# Patient Record
Sex: Female | Born: 1965 | Race: Black or African American | Hispanic: No | Marital: Single | Smoking: Current some day smoker
Health system: Southern US, Community
[De-identification: ages and names within clinical notes are randomized; demographics above are authoritative.]

---

## 2008-07-01 ENCOUNTER — Ambulatory Visit: Payer: Self-pay | Admitting: Radiology

## 2008-07-01 ENCOUNTER — Emergency Department (HOSPITAL_BASED_OUTPATIENT_CLINIC_OR_DEPARTMENT_OTHER): Admission: EM | Admit: 2008-07-01 | Discharge: 2008-07-01 | Payer: Self-pay | Admitting: Emergency Medicine

## 2010-05-23 LAB — DIFFERENTIAL
Basophils Absolute: 0.1 10*3/uL (ref 0.0–0.1)
Basophils Relative: 1 % (ref 0–1)
Eosinophils Absolute: 0.1 10*3/uL (ref 0.0–0.7)
Monocytes Absolute: 0.3 10*3/uL (ref 0.1–1.0)
Neutro Abs: 5.8 10*3/uL (ref 1.7–7.7)

## 2010-05-23 LAB — URINALYSIS, ROUTINE W REFLEX MICROSCOPIC
Glucose, UA: NEGATIVE mg/dL
Ketones, ur: 15 mg/dL — AB
Protein, ur: NEGATIVE mg/dL
Urobilinogen, UA: 1 mg/dL (ref 0.0–1.0)

## 2010-05-23 LAB — CBC
Hemoglobin: 12.5 g/dL (ref 12.0–15.0)
MCHC: 35.5 g/dL (ref 30.0–36.0)
MCV: 96.6 fL (ref 78.0–100.0)
RDW: 12.1 % (ref 11.5–15.5)

## 2010-05-23 LAB — BASIC METABOLIC PANEL
CO2: 28 mEq/L (ref 19–32)
Calcium: 8.9 mg/dL (ref 8.4–10.5)
Chloride: 101 mEq/L (ref 96–112)
Creatinine, Ser: 0.7 mg/dL (ref 0.4–1.2)
Glucose, Bld: 104 mg/dL — ABNORMAL HIGH (ref 70–99)

## 2010-05-23 LAB — POCT CARDIAC MARKERS
CKMB, poc: <1 ng/mL — ABNORMAL LOW (ref 1.0–8.0)
Myoglobin, poc: 21.3 ng/mL (ref 12–200)
Troponin i, poc: <0.05 ng/mL (ref 0.00–0.09)

## 2010-05-23 LAB — URINE MICROSCOPIC-ADD ON

## 2012-03-18 ENCOUNTER — Encounter (HOSPITAL_BASED_OUTPATIENT_CLINIC_OR_DEPARTMENT_OTHER): Payer: Self-pay | Admitting: *Deleted

## 2012-03-18 ENCOUNTER — Emergency Department (HOSPITAL_BASED_OUTPATIENT_CLINIC_OR_DEPARTMENT_OTHER)
Admission: EM | Admit: 2012-03-18 | Discharge: 2012-03-18 | Disposition: A | Discharge status: Home / Self Care | Payer: BC Managed Care – PPO | Attending: Emergency Medicine | Admitting: Emergency Medicine

## 2012-03-18 DIAGNOSIS — Y929 Unspecified place or not applicable: Secondary | ICD-10-CM | POA: Insufficient documentation

## 2012-03-18 DIAGNOSIS — S39012A Strain of muscle, fascia and tendon of lower back, initial encounter: Secondary | ICD-10-CM

## 2012-03-18 DIAGNOSIS — F17200 Nicotine dependence, unspecified, uncomplicated: Secondary | ICD-10-CM | POA: Insufficient documentation

## 2012-03-18 DIAGNOSIS — X500XXA Overexertion from strenuous movement or load, initial encounter: Secondary | ICD-10-CM | POA: Insufficient documentation

## 2012-03-18 DIAGNOSIS — Y9389 Activity, other specified: Secondary | ICD-10-CM | POA: Insufficient documentation

## 2012-03-18 DIAGNOSIS — S335XXA Sprain of ligaments of lumbar spine, initial encounter: Secondary | ICD-10-CM | POA: Insufficient documentation

## 2012-03-18 MED ORDER — CYCLOBENZAPRINE HCL 10 MG PO TABS: 10.0000 mg | ORAL_TABLET | Freq: Two times a day (BID) | ORAL | Status: DC | PRN

## 2012-03-18 MED ORDER — PREDNISONE 10 MG PO TABS: 20.0000 mg | ORAL_TABLET | Freq: Every day | ORAL | Status: AC

## 2012-03-18 MED ORDER — OXYCODONE-ACETAMINOPHEN 5-325 MG PO TABS: 1.0000 | ORAL_TABLET | ORAL | Status: AC | PRN

## 2012-03-18 NOTE — ED Provider Notes (Addendum)
History     CSN: [id number]  Arrival date & time 03/18/12  0940   First MD Initiated Contact with Patient 03/18/12 0957      Chief Complaint  Patient presents with  . Back Pain    (Consider location/radiation/quality/duration/timing/severity/associated sxs/prior treatment) HPI  47 y.o. Female with back pain for five days.  Thinks she may have strained back helping her mother up from fall.  Pain is low back non radiating crampy, constant, heating pad with some relief, took ibuprofen without relief.  Worsens with movement especially standing still in one spot.  Denies radiation to legs, focal numbness, weakness, uti symptoms, fever, chills, nausea, vomiting. PMD-   History reviewed. No pertinent past medical history.  History reviewed. No pertinent past surgical history.  No family history on file.  History  Substance Use Topics  . Smoking status: Current Some Day Smoker  . Smokeless tobacco: Not on file  . Alcohol Use: Yes     Comment: occassionally    OB History    Grav Para Term Preterm Abortions TAB SAB Ect Mult Living                  Review of Systems  All other systems reviewed and are negative.    Allergies  Review of patient's allergies indicates no known allergies.  Home Medications  No current outpatient prescriptions on file.  BP 121/78  Pulse 89  Temp 98.2 F (36.8 C) (Oral)  Resp 20  Ht 5\' 6"  (1.676 m)  Wt 185 lb (83.915 kg)  BMI 29.86 kg/m2  SpO2 98%  Physical Exam  Nursing note and vitals reviewed. Constitutional: She is oriented to person, place, and time. She appears well-developed and well-nourished.  HENT:  Head: Normocephalic and atraumatic.  Right Ear: External ear normal.  Left Ear: External ear normal.  Nose: Nose normal.  Mouth/Throat: Oropharynx is clear and moist.  Eyes: Conjunctivae normal and EOM are normal. Pupils are equal, round, and reactive to light.  Neck: Normal range of motion. Neck supple.  Cardiovascular:  Normal rate, regular rhythm, normal heart sounds and intact distal pulses.   Pulmonary/Chest: Effort normal and breath sounds normal.  Abdominal: Soft. Bowel sounds are normal.  Musculoskeletal: Normal range of motion.       Back visual normal, full arom, bilateral ttp low back paravertebral  Neurological: She is alert and oriented to person, place, and time. She has normal strength and normal reflexes. No sensory deficit. GCS eye subscore is 4. GCS verbal subscore is 5. GCS motor subscore is 6.  Skin: Skin is warm and dry.  Psychiatric: She has a normal mood and affect. Her behavior is normal. Judgment and thought content normal.    ED Course  Procedures (including critical care time)  Labs Reviewed - No data to display No results found.   No diagnosis found.    MDM  Patient with back pain.  No neurological deficits and normal neuro exam.  Patient can walk but states is painful.  No loss of bowel or bladder control.  No concern for cauda equina.  No fever, night sweats, weight loss, h/o cancer, IVDU.  RICE protocol and pain medicine indicated and discussed with patient.         Hilario Quarry, MD 03/18/12 1007  Hilario Quarry, MD 03/22/12 [phone]

## 2012-03-18 NOTE — ED Notes (Signed)
Patient states she has had lower back pain radiating into her buttocks for the last 5 days after lifting her mother who fell.

## 2012-04-01 ENCOUNTER — Encounter (HOSPITAL_BASED_OUTPATIENT_CLINIC_OR_DEPARTMENT_OTHER): Payer: Self-pay | Admitting: *Deleted

## 2012-04-01 ENCOUNTER — Emergency Department (HOSPITAL_BASED_OUTPATIENT_CLINIC_OR_DEPARTMENT_OTHER)
Admission: EM | Admit: 2012-04-01 | Discharge: 2012-04-01 | Disposition: A | Discharge status: Home / Self Care | Payer: BC Managed Care – PPO | Attending: Emergency Medicine | Admitting: Emergency Medicine

## 2012-04-01 DIAGNOSIS — R059 Cough, unspecified: Secondary | ICD-10-CM | POA: Insufficient documentation

## 2012-04-01 DIAGNOSIS — R05 Cough: Secondary | ICD-10-CM | POA: Insufficient documentation

## 2012-04-01 DIAGNOSIS — J029 Acute pharyngitis, unspecified: Secondary | ICD-10-CM

## 2012-04-01 DIAGNOSIS — H6690 Otitis media, unspecified, unspecified ear: Secondary | ICD-10-CM | POA: Insufficient documentation

## 2012-04-01 DIAGNOSIS — F17200 Nicotine dependence, unspecified, uncomplicated: Secondary | ICD-10-CM | POA: Insufficient documentation

## 2012-04-01 DIAGNOSIS — H6691 Otitis media, unspecified, right ear: Secondary | ICD-10-CM

## 2012-04-01 DIAGNOSIS — R6883 Chills (without fever): Secondary | ICD-10-CM | POA: Insufficient documentation

## 2012-04-01 MED ORDER — AZITHROMYCIN 250 MG PO TABS: 250.0000 mg | ORAL_TABLET | Freq: Every day | ORAL | Status: AC

## 2012-04-01 MED ORDER — NAPROXEN 500 MG PO TABS: 500.0000 mg | ORAL_TABLET | Freq: Two times a day (BID) | ORAL | Status: DC

## 2012-04-01 NOTE — ED Notes (Signed)
Patient states she developed a sore throat and right ear pain 3 days ago.  Mild non productive cough.  Denies fever.

## 2012-04-01 NOTE — ED Provider Notes (Signed)
History     CSN: [id number]  Arrival date & time 04/01/12  4098   First MD Initiated Contact with Patient 04/01/12 [phone]      Chief Complaint  Patient presents with  . Sore Throat  . Otalgia    right    (Consider location/radiation/quality/duration/timing/severity/associated sxs/prior treatment) Patient is a 47 y.o. female presenting with pharyngitis and ear pain. The history is provided by the patient.  Sore Throat Pertinent negatives include no chest pain, no abdominal pain, no headaches and no shortness of breath.  Otalgia Associated symptoms: cough and sore throat   Associated symptoms: no abdominal pain, no fever, no headaches and no rash    patient presents with a complaint of sore throat on the right side of right ear pain for 3 days associated with a mild nonproductive cough no fever but does state chills. Patient's been treating it with over-the-counter cold and cough meds. Throat pain is listed as 8/10 made worse with swallowing and also makes your her when she swallows.  History reviewed. No pertinent past medical history.  History reviewed. No pertinent past surgical history.  No family history on file.  History  Substance Use Topics  . Smoking status: Current Some Day Smoker  . Smokeless tobacco: Not on file  . Alcohol Use: Yes     Comment: occassionally    OB History   Grav Para Term Preterm Abortions TAB SAB Ect Mult Living                  Review of Systems  Constitutional: Positive for chills. Negative for fever.  HENT: Positive for ear pain and sore throat. Negative for voice change and sinus pressure.   Eyes: Negative for redness.  Respiratory: Positive for cough. Negative for shortness of breath.   Cardiovascular: Negative for chest pain.  Gastrointestinal: Negative for abdominal pain.  Genitourinary: Negative for dysuria.  Musculoskeletal: Negative for back pain.  Skin: Negative for rash.  Neurological: Negative for headaches.   Hematological: Does not bruise/bleed easily.    Allergies  Review of patient's allergies indicates no known allergies.  Home Medications   Current Outpatient Rx  Name  Route  Sig  Dispense  Refill  . azithromycin (ZITHROMAX) 250 MG tablet   Oral   Take 1 tablet (250 mg total) by mouth daily. Take first 2 tablets together, then 1 every day until finished.   6 tablet   0   . cyclobenzaprine (FLEXERIL) 10 MG tablet   Oral   Take 1 tablet (10 mg total) by mouth 2 (two) times daily as needed for muscle spasms.   20 tablet   0   . naproxen (NAPROSYN) 500 MG tablet   Oral   Take 1 tablet (500 mg total) by mouth 2 (two) times daily.   14 tablet   0   . predniSONE (DELTASONE) 10 MG tablet   Oral   Take 2 tablets (20 mg total) by mouth daily.   15 tablet   0     BP 122/72  Pulse 94  Temp(Src) 99.7 F (37.6 C) (Oral)  Resp 18  SpO2 100%  Physical Exam  Nursing note and vitals reviewed. Constitutional: She is oriented to person, place, and time. She appears well-developed and well-nourished. No distress.  HENT:  Head: Normocephalic and atraumatic.  Left Ear: External ear normal.  Mouth/Throat: Oropharynx is clear and moist. No oropharyngeal exudate.  Right eardrum is erythematous with slight bulging.  Eyes: Conjunctivae and EOM are  normal. Pupils are equal, round, and reactive to light.  Neck: Normal range of motion. Neck supple. No tracheal deviation present.  Cardiovascular: Normal rate, regular rhythm and normal heart sounds.   No murmur heard. Pulmonary/Chest: Effort normal and breath sounds normal. No stridor. No respiratory distress. She has no wheezes. She has no rales.  Abdominal: Soft. Bowel sounds are normal. There is no tenderness.  Musculoskeletal: Normal range of motion. She exhibits no edema.  Lymphadenopathy:    She has cervical adenopathy.  Neurological: She is alert and oriented to person, place, and time. No cranial nerve deficit. She exhibits  normal muscle tone. Coordination normal.  Skin: Skin is warm. No rash noted.    ED Course  Procedures (including critical care time)  Labs Reviewed - No data to display No results found.   1. Otitis media of right ear   2. Pharyngitis       MDM  Physical exam consistent with a right otitis media. Suspect a viral upper respiratory infection with secondary ear infection. Pink sore throat is also viral related however if you were to be strep related to the Zithromax will resolve strep pharyngitis.        Shelda Jakes, MD 04/01/12 [phone]

## 2014-08-31 ENCOUNTER — Encounter (HOSPITAL_BASED_OUTPATIENT_CLINIC_OR_DEPARTMENT_OTHER): Payer: Self-pay | Admitting: *Deleted

## 2014-08-31 ENCOUNTER — Emergency Department (HOSPITAL_BASED_OUTPATIENT_CLINIC_OR_DEPARTMENT_OTHER)
Admission: EM | Admit: 2014-08-31 | Discharge: 2014-08-31 | Disposition: A | Discharge status: Home / Self Care | Payer: BC Managed Care – PPO | Attending: Emergency Medicine | Admitting: Emergency Medicine

## 2014-08-31 DIAGNOSIS — Z7952 Long term (current) use of systemic steroids: Secondary | ICD-10-CM | POA: Diagnosis not present

## 2014-08-31 DIAGNOSIS — Z791 Long term (current) use of non-steroidal anti-inflammatories (NSAID): Secondary | ICD-10-CM | POA: Insufficient documentation

## 2014-08-31 DIAGNOSIS — Z720 Tobacco use: Secondary | ICD-10-CM | POA: Insufficient documentation

## 2014-08-31 DIAGNOSIS — A5901 Trichomonal vulvovaginitis: Secondary | ICD-10-CM | POA: Insufficient documentation

## 2014-08-31 DIAGNOSIS — Z792 Long term (current) use of antibiotics: Secondary | ICD-10-CM | POA: Insufficient documentation

## 2014-08-31 DIAGNOSIS — N390 Urinary tract infection, site not specified: Secondary | ICD-10-CM

## 2014-08-31 DIAGNOSIS — Z3202 Encounter for pregnancy test, result negative: Secondary | ICD-10-CM | POA: Insufficient documentation

## 2014-08-31 DIAGNOSIS — A599 Trichomoniasis, unspecified: Secondary | ICD-10-CM

## 2014-08-31 DIAGNOSIS — R300 Dysuria: Secondary | ICD-10-CM | POA: Diagnosis present

## 2014-08-31 LAB — URINE MICROSCOPIC-ADD ON

## 2014-08-31 LAB — URINALYSIS, ROUTINE W REFLEX MICROSCOPIC
BILIRUBIN URINE: NEGATIVE
Glucose, UA: NEGATIVE mg/dL
Ketones, ur: NEGATIVE mg/dL
Nitrite: NEGATIVE
PH: 5.5 (ref 5.0–8.0)
PROTEIN: NEGATIVE mg/dL
SPECIFIC GRAVITY, URINE: 1.016 (ref 1.005–1.030)
UROBILINOGEN UA: 0.2 mg/dL (ref 0.0–1.0)

## 2014-08-31 LAB — PREGNANCY, URINE: PREG TEST UR: NEGATIVE

## 2014-08-31 MED ORDER — METRONIDAZOLE 500 MG PO TABS: 2000.0000 mg | ORAL_TABLET | Freq: Once | ORAL | Status: AC

## 2014-08-31 MED ORDER — CEPHALEXIN 500 MG PO CAPS: 500.0000 mg | ORAL_CAPSULE | Freq: Four times a day (QID) | ORAL | Status: AC

## 2014-08-31 NOTE — Discharge Instructions (Signed)
Return to the ED with any concerns including vomiting and not able to keep down liquids or antibiotics, abdominal pain, decreased level of alertness/lethargy, or any other alarming symptoms

## 2014-08-31 NOTE — ED Notes (Signed)
MD at bedside. 

## 2014-08-31 NOTE — ED Provider Notes (Signed)
CSN: [id number]     Arrival date & time 08/31/14  4782 History   First MD Initiated Contact with Patient 08/31/14 [phone]     Chief Complaint  Patient presents with  . Dysuria     (Consider location/radiation/quality/duration/timing/severity/associated sxs/prior Treatment) HPI  Pt presenting with c/o 2 days of dysuria.  She feels burning with urination.  No frequency or urgency.  No vomiting or abdominal pain.  Had some mild right sided low back pain yesterday, but no pain now.  Has had UTI in the past with similar symptoms- this was years ago.  No recent antibitoics.  No vaginal bleeding or discharge.  She has not had any treatment prior to arrival. There are no other associated systemic symptoms, there are no other alleviating or modifying factors.   History reviewed. No pertinent past medical history. History reviewed. No pertinent past surgical history. No family history on file. History  Substance Use Topics  . Smoking status: Current Some Day Smoker  . Smokeless tobacco: Not on file  . Alcohol Use: Yes     Comment: occassionally   OB History    No data available     Review of Systems  ROS reviewed and all otherwise negative except for mentioned in HPI    Allergies  Review of patient's allergies indicates no known allergies.  Home Medications   Prior to Admission medications   Medication Sig Start Date End Date Taking? Authorizing Provider  azithromycin (ZITHROMAX) 250 MG tablet Take 1 tablet (250 mg total) by mouth daily. Take first 2 tablets together, then 1 every day until finished. 04/01/12   Vanetta Mulders, MD  cephALEXin (KEFLEX) 500 MG capsule Take 1 capsule (500 mg total) by mouth 4 (four) times daily. 08/31/14   Jerelyn Scott, MD  cyclobenzaprine (FLEXERIL) 10 MG tablet Take 1 tablet (10 mg total) by mouth 2 (two) times daily as needed for muscle spasms. 03/18/12   Margarita Grizzle, MD  metroNIDAZOLE (FLAGYL) 500 MG tablet Take 4 tablets (2,000 mg total) by mouth once.  08/31/14   Jerelyn Scott, MD  naproxen (NAPROSYN) 500 MG tablet Take 1 tablet (500 mg total) by mouth 2 (two) times daily. 04/01/12   Vanetta Mulders, MD  predniSONE (DELTASONE) 10 MG tablet Take 2 tablets (20 mg total) by mouth daily. 03/18/12   Margarita Grizzle, MD   BP 133/80 mmHg  Pulse 85  Temp(Src) 97.9 F (36.6 C) (Oral)  Resp 16  Ht  (1.676 m)  Wt 199 lb 3.2 oz (90.357 kg)  BMI 32.17 kg/m2  SpO2 100%  LMP 07/01/2014  Vitals reviewed Physical Exam  Physical Examination: General appearance - alert, well appearing, and in no distress Mental status - alert, oriented to person, place, and time Eyes - no conjunctival injection, no scleral icterus Mouth - mucous membranes moist, pharynx normal without lesions Chest - clear to auscultation, no wheezes, rales or rhonchi, symmetric air entry Heart - normal rate, regular rhythm, normal S1, S2, no murmurs, rubs, clicks or gallops Abdomen - soft, nontender, nondistended, no masses or organomegaly Back exam - no CVA tenderness, no midline tenderness Extremities - peripheral pulses normal, no pedal edema, no clubbing or cyanosis Skin - normal coloration and turgor, no rashes  ED Course  Procedures (including critical care time) Labs Review Labs Reviewed  URINALYSIS, ROUTINE W REFLEX MICROSCOPIC (NOT AT [facility]) - Abnormal; Notable for the following:    APPearance CLOUDY (*)    Hgb urine dipstick LARGE (*)    Leukocytes,  UA MODERATE (*)    All other components within normal limits  URINE MICROSCOPIC-ADD ON - Abnormal; Notable for the following:    Squamous Epithelial / LPF FEW (*)    Bacteria, UA MANY (*)    All other components within normal limits  PREGNANCY, URINE  GC/CHLAMYDIA PROBE AMP ([place]) [facility]    Imaging Review No results found.   EKG Interpretation None      MDM   Final diagnoses:  UTI (lower urinary tract infection)  Trichomonas infection    Pt presenting with c/o burning with urination, no  abdominal pain.  Pt advised of UA c/w UTI as well as trichomonas present.  Given rx for keflex and flagyl.  Gc/chlamydia sent,  No abdominal tenderness on exam to warrant pelvic,  No concern for PID.   Patient is overall nontoxic and well hydrated in appearance.   Discharged with strict return precautions.  Pt agreeable with plan.     Jerelyn ScottMartha Linker, MD 08/31/14 [phone]

## 2014-08-31 NOTE — ED Notes (Signed)
Pt c/o burning with urination and lower abd pressure. Pt denies N/V but sts she's had "a little" diarrhea.

## 2014-09-01 LAB — GC/CHLAMYDIA PROBE AMP (~~LOC~~) NOT AT ARMC
Chlamydia: NEGATIVE
NEISSERIA GONORRHEA: NEGATIVE

## 2020-01-14 ENCOUNTER — Encounter (HOSPITAL_BASED_OUTPATIENT_CLINIC_OR_DEPARTMENT_OTHER): Payer: Self-pay | Admitting: *Deleted

## 2020-01-14 ENCOUNTER — Emergency Department (HOSPITAL_BASED_OUTPATIENT_CLINIC_OR_DEPARTMENT_OTHER): Payer: BC Managed Care – PPO

## 2020-01-14 ENCOUNTER — Emergency Department (HOSPITAL_BASED_OUTPATIENT_CLINIC_OR_DEPARTMENT_OTHER)
Admission: EM | Admit: 2020-01-14 | Discharge: 2020-01-14 | Disposition: A | Discharge status: Home / Self Care | Payer: BC Managed Care – PPO | Attending: Emergency Medicine | Admitting: Emergency Medicine

## 2020-01-14 ENCOUNTER — Other Ambulatory Visit: Payer: Self-pay

## 2020-01-14 DIAGNOSIS — M545 Low back pain, unspecified: Secondary | ICD-10-CM

## 2020-01-14 DIAGNOSIS — R1033 Periumbilical pain: Secondary | ICD-10-CM | POA: Diagnosis not present

## 2020-01-14 DIAGNOSIS — F17200 Nicotine dependence, unspecified, uncomplicated: Secondary | ICD-10-CM | POA: Insufficient documentation

## 2020-01-14 DIAGNOSIS — R109 Unspecified abdominal pain: Secondary | ICD-10-CM | POA: Diagnosis present

## 2020-01-14 LAB — URINALYSIS, MICROSCOPIC (REFLEX)

## 2020-01-14 LAB — CBC WITH DIFFERENTIAL/PLATELET
Abs Immature Granulocytes: 0.03 10*3/uL (ref 0.00–0.07)
Basophils Absolute: 0.1 10*3/uL (ref 0.0–0.1)
Basophils Relative: 1 %
Eosinophils Absolute: 0.1 10*3/uL (ref 0.0–0.5)
Eosinophils Relative: 1 %
HCT: 38.3 % (ref 36.0–46.0)
Hemoglobin: 12.8 g/dL (ref 12.0–15.0)
Immature Granulocytes: 0 %
Lymphocytes Relative: 32 %
Lymphs Abs: 2.3 10*3/uL (ref 0.7–4.0)
MCH: 33.7 pg (ref 26.0–34.0)
MCHC: 33.4 g/dL (ref 30.0–36.0)
MCV: 100.8 fL — ABNORMAL HIGH (ref 80.0–100.0)
Monocytes Absolute: 0.4 10*3/uL (ref 0.1–1.0)
Monocytes Relative: 6 %
Neutro Abs: 4.3 10*3/uL (ref 1.7–7.7)
Neutrophils Relative %: 60 %
Platelets: 186 10*3/uL (ref 150–400)
RBC: 3.8 MIL/uL — ABNORMAL LOW (ref 3.87–5.11)
RDW: 13 % (ref 11.5–15.5)
WBC: 7.2 10*3/uL (ref 4.0–10.5)
nRBC: 0 % (ref 0.0–0.2)

## 2020-01-14 LAB — COMPREHENSIVE METABOLIC PANEL
ALT: 89 U/L — ABNORMAL HIGH (ref 0–44)
AST: 149 U/L — ABNORMAL HIGH (ref 15–41)
Albumin: 4 g/dL (ref 3.5–5.0)
Alkaline Phosphatase: 64 U/L (ref 38–126)
Anion gap: 9 (ref 5–15)
BUN: 12 mg/dL (ref 6–20)
CO2: 27 mmol/L (ref 22–32)
Calcium: 9.4 mg/dL (ref 8.9–10.3)
Chloride: 101 mmol/L (ref 98–111)
Creatinine, Ser: 0.6 mg/dL (ref 0.44–1.00)
GFR, Estimated: >60 mL/min (ref >60)
Glucose, Bld: 116 mg/dL — ABNORMAL HIGH (ref 70–99)
Potassium: 3.8 mmol/L (ref 3.5–5.1)
Sodium: 137 mmol/L (ref 135–145)
Total Bilirubin: 0.6 mg/dL (ref 0.3–1.2)
Total Protein: 7.8 g/dL (ref 6.5–8.1)

## 2020-01-14 LAB — LIPASE, BLOOD: Lipase: 24 U/L (ref 11–51)

## 2020-01-14 LAB — URINALYSIS, ROUTINE W REFLEX MICROSCOPIC
Bilirubin Urine: NEGATIVE
Glucose, UA: NEGATIVE mg/dL
Hgb urine dipstick: NEGATIVE
Ketones, ur: NEGATIVE mg/dL
Nitrite: NEGATIVE
Protein, ur: NEGATIVE mg/dL
Specific Gravity, Urine: 1.015 (ref 1.005–1.030)
pH: 6.5 (ref 5.0–8.0)

## 2020-01-14 LAB — PREGNANCY, URINE: Preg Test, Ur: NEGATIVE

## 2020-01-14 MED ORDER — NAPROXEN 500 MG PO TABS: 500.0000 mg | ORAL_TABLET | Freq: Two times a day (BID) | ORAL | 0 refills | Status: AC

## 2020-01-14 MED ORDER — METHOCARBAMOL 500 MG PO TABS: 500.0000 mg | ORAL_TABLET | Freq: Three times a day (TID) | ORAL | 0 refills | Status: AC | PRN

## 2020-01-14 MED ORDER — ALUM & MAG HYDROXIDE-SIMETH 200-200-20 MG/5ML PO SUSP
30.0000 mL | Freq: Once | ORAL | Status: AC
  Administered 2020-01-14: 30 mL via ORAL
  Filled 2020-01-14: qty 30

## 2020-01-14 MED ORDER — LIDOCAINE VISCOUS HCL 2 % MT SOLN
15.0000 mL | Freq: Once | OROMUCOSAL | Status: AC
  Administered 2020-01-14: 15 mL via ORAL
  Filled 2020-01-14: qty 15

## 2020-01-14 MED ORDER — IOHEXOL 300 MG/ML  SOLN
100.0000 mL | Freq: Once | INTRAMUSCULAR | Status: AC
  Administered 2020-01-14: 100 mL via INTRAVENOUS

## 2020-01-14 NOTE — Discharge Instructions (Addendum)
Like we discussed, I am prescribing 2 medications.  The first medication is called naproxen.  This is an anti-inflammatory.  Please take it as prescribed.  Please take it with food to prevent upsetting your stomach.  The next medication is called Robaxin.  This is a muscle relaxer.  Please do not mix this medication with alcohol.  Do not operate a motor vehicle after taking it.  Please stop taking Flexeril as well as tizanidine.  These are both muscle relaxers.  You do not need to mix these medications.  If you develop any new or worsening symptoms you need to return to the emergency department for reevaluation.  Otherwise, please follow-up with your regular doctor.  It was a pleasure to meet you.

## 2020-01-14 NOTE — ED Triage Notes (Signed)
Sent here form [facility] for r/o appendicitis , pt c/o right flank pain  which radiates around to right lower abd x 1 week

## 2020-01-14 NOTE — ED Provider Notes (Signed)
[facility] EMERGENCY DEPARTMENT Provider Note   CSN: [id number] Arrival date & time: 01/14/20  1528     History Chief Complaint  Patient presents with  . Flank Pain    Colleen Ball is a 54 y.o. female.  HPI Patient is a 54 year old female who presents to the emergency department due to abdominal pain as well as back pain.  Patient states about 1.5 months ago she fell and began experiencing right low back pain.  She has taken muscle relaxers for this with mild intermittent improvement.  About 1 week ago she began experiencing abdominal pain that is stabbing and intermittent.  Seems to worsen with eating and palpation.  She was evaluated at urgent care earlier today and recommended that she come to the emergency department for appendicitis rule out.  No fevers, chills, nausea, vomiting, diarrhea, chest pain, shortness of breath, urinary changes.    History reviewed. No pertinent past medical history.  There are no problems to display for this patient.   History reviewed. No pertinent surgical history.   OB History   No obstetric history on file.     No family history on file.  Social History   Tobacco Use  . Smoking status: Current Some Day Smoker  Substance Use Topics  . Alcohol use: Yes    Comment: occassionally  . Drug use: No    Home Medications Prior to Admission medications   Medication Sig Start Date End Date Taking? Authorizing Provider  azithromycin (ZITHROMAX) 250 MG tablet Take 1 tablet (250 mg total) by mouth daily. Take first 2 tablets together, then 1 every day until finished. 04/01/12   Vanetta MuldersZackowski, Scott, MD  cephALEXin (KEFLEX) 500 MG capsule Take 1 capsule (500 mg total) by mouth 4 (four) times daily. 08/31/14   Mabe, Latanya MaudlinMartha L, MD  cyclobenzaprine (FLEXERIL) 10 MG tablet Take 1 tablet (10 mg total) by mouth 2 (two) times daily as needed for muscle spasms. 03/18/12   Margarita Grizzleay, Danielle, MD  metroNIDAZOLE (FLAGYL) 500 MG tablet Take 4 tablets  (2,000 mg total) by mouth once. 08/31/14   Mabe, Latanya MaudlinMartha L, MD  naproxen (NAPROSYN) 500 MG tablet Take 1 tablet (500 mg total) by mouth 2 (two) times daily. 04/01/12   Vanetta MuldersZackowski, Scott, MD  predniSONE (DELTASONE) 10 MG tablet Take 2 tablets (20 mg total) by mouth daily. 03/18/12   Margarita Grizzleay, Danielle, MD    Allergies    Patient has no known allergies.  Review of Systems   Review of Systems  All other systems reviewed and are negative. Ten systems reviewed and are negative for acute change, except as noted in the HPI.    Physical Exam Updated Vital Signs BP (!) 148/88 (BP Location: Left Arm)   Pulse 78   Temp 98.4 F (36.9 C) (Oral)   Resp 18   Ht 5\' 6"  (1.676 m)   Wt 91.2 kg   SpO2 100%   BMI 32.44 kg/m   Physical Exam Vitals and nursing note reviewed.  Constitutional:      General: She is not in acute distress.    Appearance: Normal appearance. She is not ill-appearing, toxic-appearing or diaphoretic.  HENT:     Head: Normocephalic and atraumatic.     Right Ear: External ear normal.     Left Ear: External ear normal.     Nose: Nose normal.     Mouth/Throat:     Mouth: Mucous membranes are moist.     Pharynx: Oropharynx is clear. No oropharyngeal  exudate or posterior oropharyngeal erythema.  Eyes:     General: No scleral icterus.       Right eye: No discharge.        Left eye: No discharge.     Extraocular Movements: Extraocular movements intact.     Conjunctiva/sclera: Conjunctivae normal.  Cardiovascular:     Rate and Rhythm: Normal rate and regular rhythm.     Pulses: Normal pulses.     Heart sounds: Normal heart sounds. No murmur heard.  No friction rub. No gallop.   Pulmonary:     Effort: Pulmonary effort is normal. No respiratory distress.     Breath sounds: Normal breath sounds. No stridor. No wheezing, rhonchi or rales.  Abdominal:     General: Abdomen is flat.     Palpations: Abdomen is soft.     Tenderness: There is abdominal tenderness.     Comments: Abdomen  is soft.  Mild tenderness appreciated around the umbilicus.  Negative Murphy sign.  No McBurney's point tenderness.  Musculoskeletal:        General: Tenderness present. Normal range of motion.     Cervical back: Normal range of motion and neck supple. No tenderness.     Comments: Mild TTP noted along the right lumbar paraspinal musculature.  Palpable spasm.  Skin:    General: Skin is warm and dry.  Neurological:     General: No focal deficit present.     Mental Status: She is alert and oriented to person, place, and time.  Psychiatric:        Mood and Affect: Mood normal.        Behavior: Behavior normal.    ED Results / Procedures / Treatments   Labs (all labs ordered are listed, but only abnormal results are displayed) Labs Reviewed  URINALYSIS, ROUTINE W REFLEX MICROSCOPIC - Abnormal; Notable for the following components:      Result Value   APPearance CLOUDY (*)    Leukocytes,Ua TRACE (*)    All other components within normal limits  CBC WITH DIFFERENTIAL/PLATELET - Abnormal; Notable for the following components:   RBC 3.80 (*)    MCV 100.8 (*)    All other components within normal limits  COMPREHENSIVE METABOLIC PANEL - Abnormal; Notable for the following components:   Glucose, Bld 116 (*)    AST 149 (*)    ALT 89 (*)    All other components within normal limits  URINALYSIS, MICROSCOPIC (REFLEX) - Abnormal; Notable for the following components:   Bacteria, UA MANY (*)    All other components within normal limits  PREGNANCY, URINE  LIPASE, BLOOD    EKG None  Radiology CT ABDOMEN PELVIS W CONTRAST  Result Date: 01/14/2020 CLINICAL DATA:  54 year old female with abdominal pain. EXAM: CT ABDOMEN AND PELVIS WITH CONTRAST TECHNIQUE: Multidetector CT imaging of the abdomen and pelvis was performed using the standard protocol following bolus administration of intravenous contrast. CONTRAST:  OMNIPAQUE IOHEXOL 300 MG/ML  SOLN COMPARISON:  None. FINDINGS: Lower chest:  The visualized lung bases are clear. No intra-abdominal free air or free fluid. Hepatobiliary: There is fatty infiltration of the liver. No intrahepatic biliary duct dilatation. There is sludge in the gallbladder. No calcified stone or pericholecystic fluid. Pancreas: Unremarkable. No pancreatic ductal dilatation or surrounding inflammatory changes. Spleen: Normal in size without focal abnormality. Adrenals/Urinary Tract: The adrenal glands unremarkable. There is no hydronephrosis on either side. There is symmetric enhancement and excretion of contrast by both kidneys. The visualized ureters  and urinary bladder appear unremarkable. Stomach/Bowel: There are small scattered distal colonic diverticula without active inflammatory changes. There is no bowel obstruction or active inflammation. Normal appendix. Vascular/Lymphatic: Mild aortoiliac atherosclerotic disease. The IVC is unremarkable. No portal venous gas. There is no adenopathy. Reproductive: The uterus is anteverted. An intrauterine device is noted. Bilateral tubal ligation clips noted. The ovaries are grossly unremarkable. Other: Small fat containing umbilical hernia. Musculoskeletal: No acute or significant osseous findings. IMPRESSION: 1. No acute intra-abdominal or pelvic pathology. 2. Fatty liver. 3. Small scattered distal colonic diverticula. No bowel obstruction. Normal appendix. 4. Aortic Atherosclerosis (ICD10-I70.0). Electronically Signed   By: Elgie Collard M.D.   On: 01/14/2020 20:27    Procedures Procedures (including critical care time)  Medications Ordered in ED Medications  alum & mag hydroxide-simeth (MAALOX/MYLANTA) 200-200-20 MG/5ML suspension 30 mL (30 mLs Oral Given 01/14/20 1951)    And  lidocaine (XYLOCAINE) 2 % viscous mouth solution 15 mL (15 mLs Oral Given 01/14/20 1951)  iohexol (OMNIPAQUE) 300 MG/ML solution 100 mL (100 mLs Intravenous Contrast Given 01/14/20 2010)    ED Course  I have reviewed the triage vital signs  and the nursing notes.  Pertinent labs & imaging results that were available during my care of the patient were reviewed by me and considered in my medical decision making (see chart for details).  Clinical Course as of Jan 14 2155  Thu Jan 14, 2020  2055  IMPRESSION: 1. No acute intra-abdominal or pelvic pathology. 2. Fatty liver. 3. Small scattered distal colonic diverticula. No bowel obstruction. Normal appendix. 4. Aortic Atherosclerosis (ICD10-I70.0).  CT ABDOMEN PELVIS W CONTRAST [LJ]    Clinical Course User Index [LJ] Placido Sou, PA-C   MDM Rules/Calculators/A&P                          Patient is a 54 year old female who presents to the emergency department with abdominal pain as well as back pain.  Patient has a palpable muscle spasm to the right lower back.  She has been taking Flexeril as well as meloxicam without any relief.  We will start patient on a trial of Robaxin as well as naproxen to see if this improves her symptoms.  Patient was evaluated earlier today at urgent care for abdominal pain.  She was sent to the emergency department for rule out of appendicitis.  I obtained a CT scan of the abdomen which shows no acute intra-abdominal or pelvic pathology.  She does have fatty liver with an elevation in her AST at 149 and ALT at 89.  Scattered distal colonic diverticula.  No obstruction.  Normal appendix.  This was personally reviewed and interpreted.  Feel that patient is stable for discharge at this time and she is eager to be discharged.  She is going to follow-up with her regular doctor.  Recommended she come back to the ER with any new or worsening symptoms.  Her questions were answered and she was amicable at the time of discharge.  Her vital signs are stable.   Final Clinical Impression(s) / ED Diagnoses Final diagnoses:  Periumbilical abdominal pain  Right lumbar pain    Rx / DC Orders ED Discharge Orders         Ordered    naproxen (NAPROSYN) 500  MG tablet  2 times daily with meals        01/14/20 2202    methocarbamol (ROBAXIN) 500 MG tablet  Every 8 hours PRN  01/14/20 2202           Placido Sou, PA-C 01/15/20 0000    Sabino Donovan, MD 01/15/20 0005

## 2022-02-11 IMAGING — CT CT ABD-PELV W/ CM
2 of 5 series · 16 of 46 positions shown, 18 images · IV contrast (Omnipaque)
Comparison: None.

CLINICAL DATA: 54-year-old female with abdominal pain.

EXAM:
CT ABDOMEN AND PELVIS WITH CONTRAST
TECHNIQUE: Multidetector CT imaging of the abdomen and pelvis was performed
using the standard protocol following bolus administration of
intravenous contrast.
CONTRAST:  100mL OMNIPAQUE IOHEXOL 300 MG/ML  SOLN

[Series 2: axial st · axial · 0.76mm/px · z∈[-504,-119]mm · 13 of 87 slices shown, 15 images]
[im 5/87  soft-tissue]
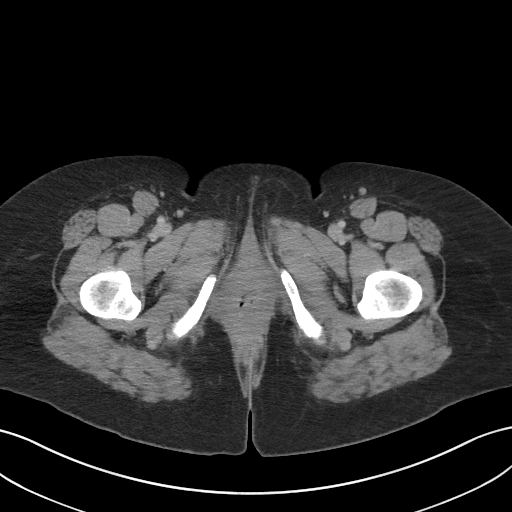
[im 5/87  bone]
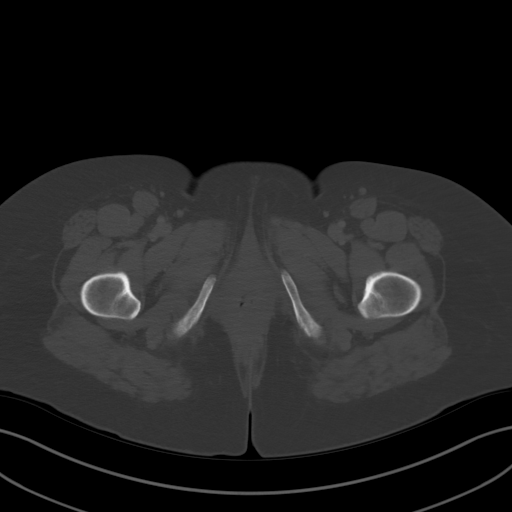
[im 14/87  soft-tissue]
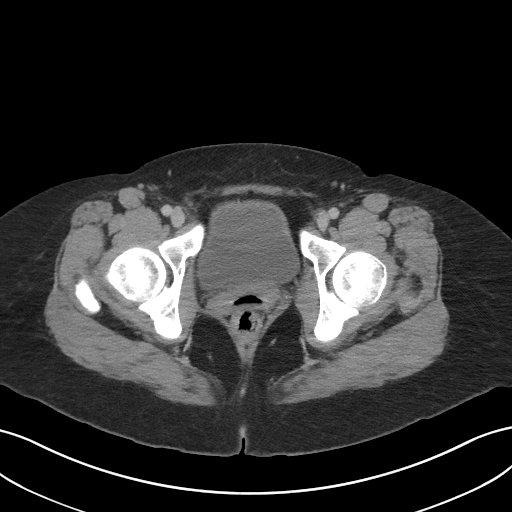
[im 19/87  soft-tissue]
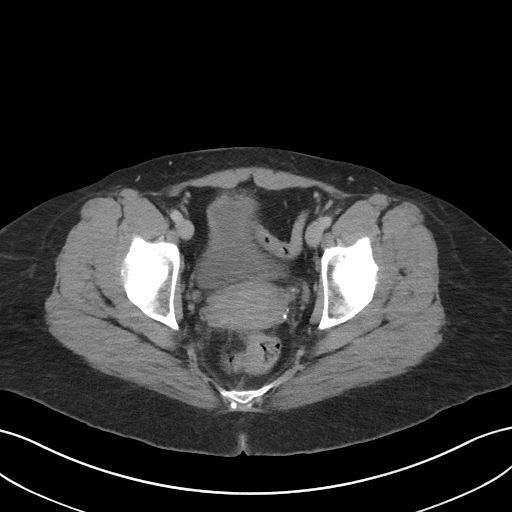
[im 23/87  soft-tissue]
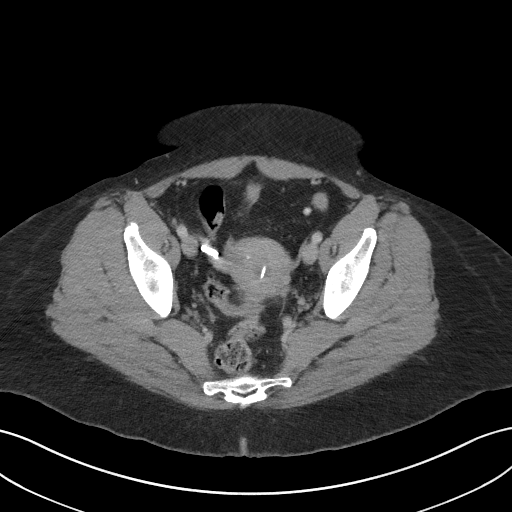
[im 32/87  soft-tissue]
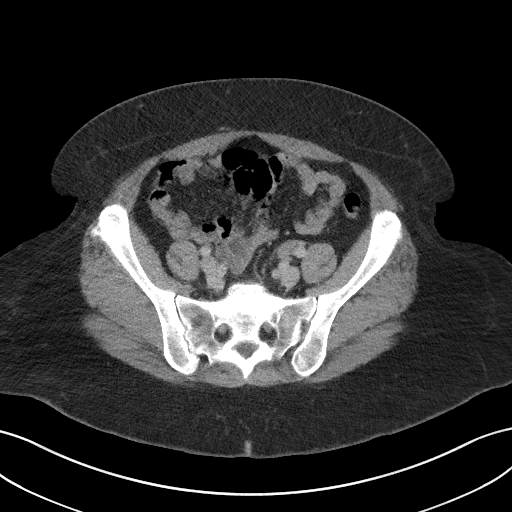
[im 37/87  soft-tissue]
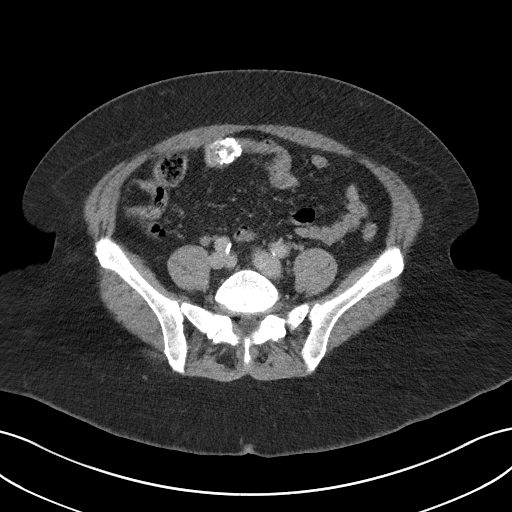
[im 46/87  soft-tissue]
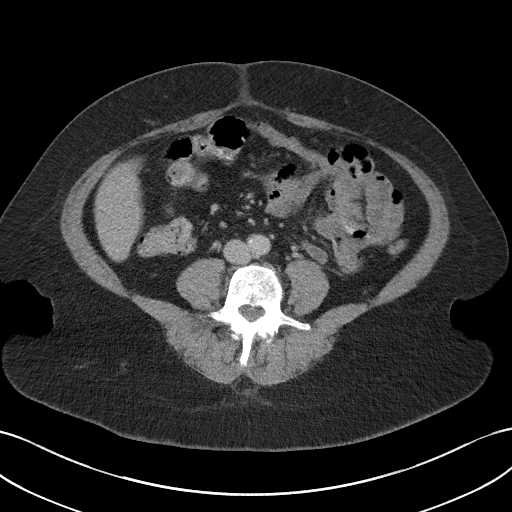
[im 50/87  soft-tissue]
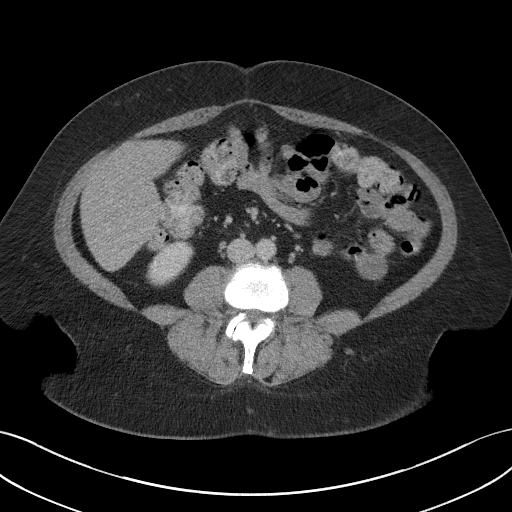
[im 55/87  soft-tissue]
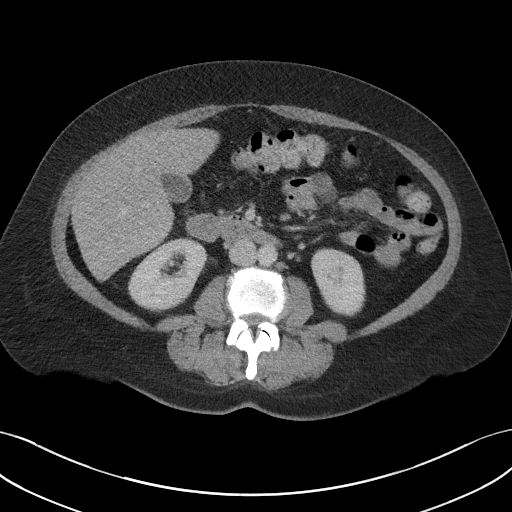
[im 55/87  bone]
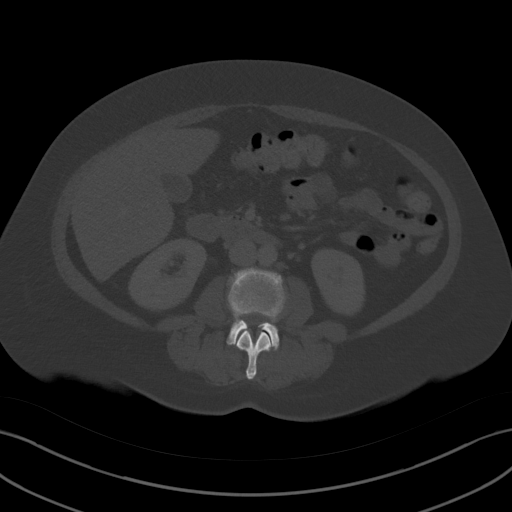
[im 64/87  soft-tissue]
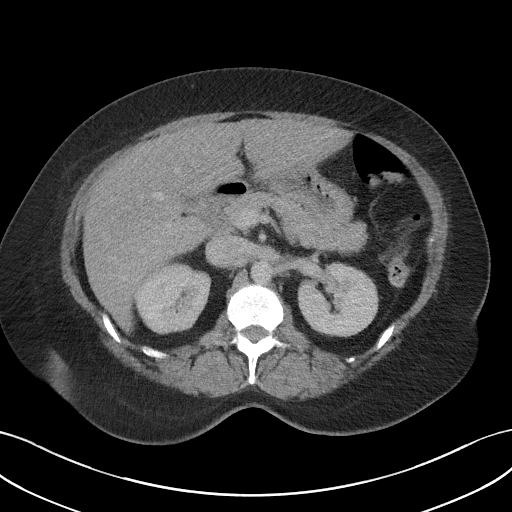
[im 68/87  soft-tissue]
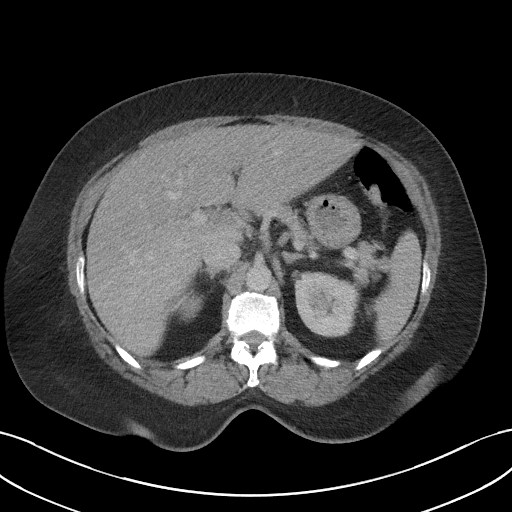
[im 73/87  soft-tissue]
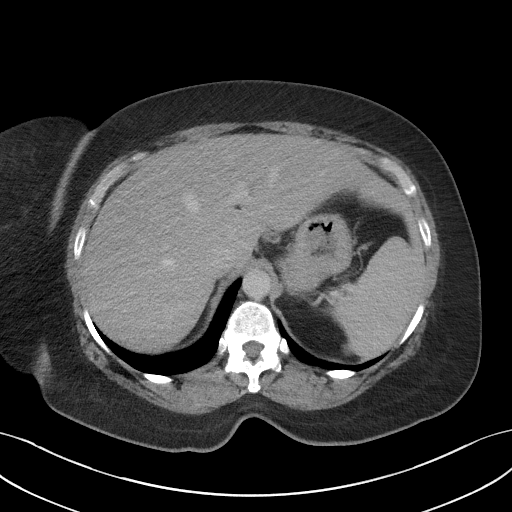
[im 82/87  soft-tissue]
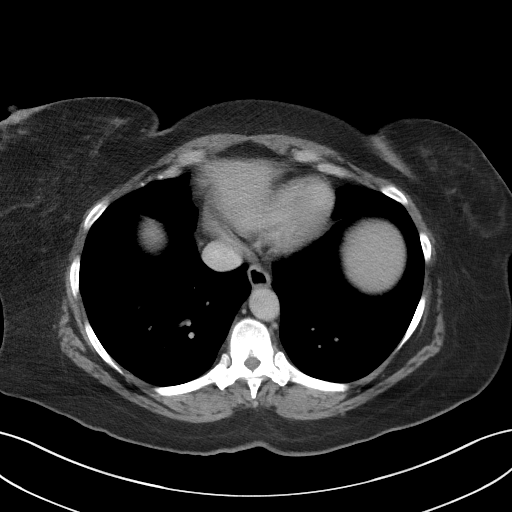

[Series 5: coronal st · coronal · 0.74mm/px · 3 of 101 slices shown]
[im 34/101  soft-tissue]
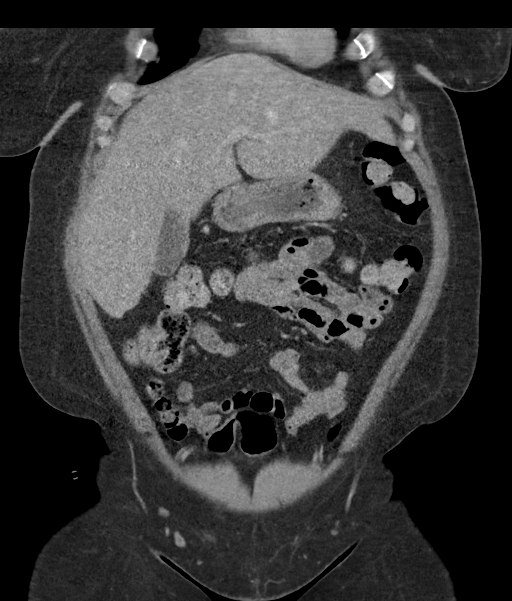
[im 45/101  soft-tissue]
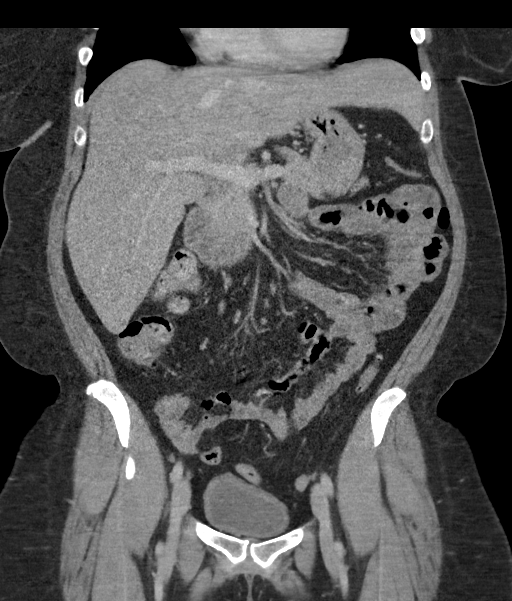
[im 56/101  soft-tissue]
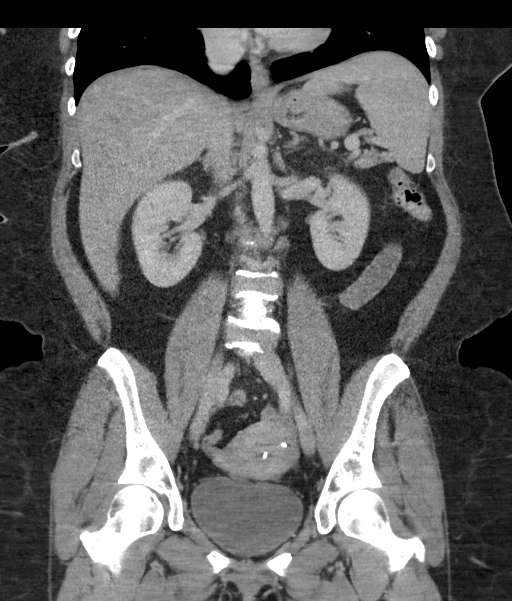

[16 of 46 positions shown; findings below may reference images not displayed]

FINDINGS: Lower chest: The visualized lung bases are clear.

No intra-abdominal free air or free fluid.

Hepatobiliary: There is fatty infiltration of the liver. No
intrahepatic biliary duct dilatation. There is sludge in the
gallbladder. No calcified stone or pericholecystic fluid.

Pancreas: Unremarkable. No pancreatic ductal dilatation or
surrounding inflammatory changes.

Spleen: Normal in size without focal abnormality.

Adrenals/Urinary Tract: The adrenal glands unremarkable. There is no
hydronephrosis on either side. There is symmetric enhancement and
excretion of contrast by both kidneys. The visualized ureters and
urinary bladder appear unremarkable.

Stomach/Bowel: There are small scattered distal colonic diverticula
without active inflammatory changes. There is no bowel obstruction
or active inflammation. Normal appendix.

Vascular/Lymphatic: Mild aortoiliac atherosclerotic disease. The IVC
is unremarkable. No portal venous gas. There is no adenopathy.

Reproductive: The uterus is anteverted. An intrauterine device is
noted. Bilateral tubal ligation clips noted. The ovaries are grossly
unremarkable.

Other: Small fat containing umbilical hernia.

Musculoskeletal: No acute or significant osseous findings.
IMPRESSION: 1. No acute intra-abdominal or pelvic pathology.
2. Fatty liver.
3. Small scattered distal colonic diverticula. No bowel obstruction.
Normal appendix.
4. Aortic Atherosclerosis (R6NHQ-W30.0).

## 2023-12-16 DIAGNOSIS — M1712 Unilateral primary osteoarthritis, left knee: Secondary | ICD-10-CM | POA: Insufficient documentation

## 2023-12-16 NOTE — Progress Notes (Signed)
 Radiation Oncology         [phone] ________________________________  Name: Colleen Ball        MRN: [id number]  Date of Service: 12/30/2023 DOB: [birth year]  CC:Pcp, No  Colleen Ball, *     REFERRING PHYSICIAN: Alexandra Lonni Ball, *   DIAGNOSES:   Osteoarthritis of L knee, M17.12 Osteoarthritis of R knee, M17.11  HISTORY OF PRESENT ILLNESS: Colleen Ball is a 58 y.o. female seen in consultation for radiation therapy.  Colleen Ball is a 58 yo F w/ symptomatic osteoarthritis of the knees, L greater than R. She reports progressive bilateral knee pain over several years, which has increasingly limited her ability to perform her occupational duties.  The pain is primarily anterior, and exacerbated by activity such as walking and prolonged standing.  Some days her L knee is more painful and/or swollen and other days her R knee is more painful and/or swollen. She describes occasional painful popping sensations and notes that her discomfort has significantly affected her quality of life and activities of daily living.  She has been evaluated by multiple orthopedic surgeons. She is s/p a left knee corticosteroid injection on 10/03/2023. CSIs provide temporary relief, typically lasting about 2 months.  She has undergone multiple rounds of steroid injections as well as prior viscosupplementation, which offered some benefit in the right knee but minimal improvement in the left. Any improvement is not long lasting. She has also tried conservative management with oral anti-inflammatories such as naproxen , celecoxib and acetaminophen , home exercise programs and activity modification. So far all of these conservative measures have been insufficient for durable symptom control. She currently takes naproxen  although it does not really help.   Most recent radiographs obtained on October 22, 2023 demonstrated moderate tricompartmental osteoarthritis with moderate to severe  patellofemoral involvement osteophyte formation and joint space narrowing.  The patient has discussed the possibility of surgical management in the future but remains hesitant at this time.  She has expressed frustration regarding the chronic nature of her symptoms and limited efficacy of prior conservative treatments.  She continues to experience diffuse anterior knee pain worse with movement and relieved by rest.  She is always in pain although resting can be helpful. She rates her pain as a 7-8/10 in severity. Medicines do not reduce her pain. She descriebs her pain as an ache or like a little hammer is just beating her knees constantly.   She is interested in exploring additional nonsurgical options for symptom management, including low-dose radiotherapy, given the chronicity and impact of the pain on function and quality of life.  PREVIOUS RADIATION THERAPY: No  AUTOIMMUNE DISEASE: No  MEDICAL DEVICES: No  PREGNANCY: No   PAST MEDICAL HISTORY: No past medical history on file.     PAST SURGICAL HISTORY:No past surgical history on file.   FAMILY HISTORY: No family history on file.   SOCIAL HISTORY:  reports that she has been smoking. She does not have any smokeless tobacco history on file. She reports current alcohol use. She reports that she does not use drugs.   ALLERGIES: Patient has no known allergies.   MEDICATIONS:  Current Outpatient Medications  Medication Sig Dispense Refill   naproxen  (NAPROSYN ) 500 MG tablet Take 1 tablet (500 mg total) by mouth 2 (two) times daily with a meal. 30 tablet 0   traMADol (ULTRAM) 50 MG tablet Take 1 tablet (50 mg total) by mouth every 6 (six) hours as needed for moderate pain (pain score  4-6) or severe pain (pain score 7-10). 120 tablet 0   zolpidem (AMBIEN) 10 MG tablet Take 10 mg by mouth at bedtime as needed for sleep.     azithromycin  (ZITHROMAX ) 250 MG tablet Take 1 tablet (250 mg total) by mouth daily. Take first 2 tablets  together, then 1 every day until finished. (Patient not taking: Reported on 12/30/2023) 6 tablet 0   cephALEXin  (KEFLEX ) 500 MG capsule Take 1 capsule (500 mg total) by mouth 4 (four) times daily. (Patient not taking: Reported on 12/30/2023) 28 capsule 0   methocarbamol  (ROBAXIN ) 500 MG tablet Take 1 tablet (500 mg total) by mouth every 8 (eight) hours as needed for muscle spasms. (Patient not taking: Reported on 12/30/2023) 12 tablet 0   metroNIDAZOLE  (FLAGYL ) 500 MG tablet Take 4 tablets (2,000 mg total) by mouth once. (Patient not taking: Reported on 12/30/2023) 4 tablet 0   predniSONE  (DELTASONE ) 10 MG tablet Take 2 tablets (20 mg total) by mouth daily. (Patient not taking: Reported on 12/30/2023) 15 tablet 0   No current facility-administered medications for this encounter.     REVIEW OF SYSTEMS: The patient reports that she is generally well overall and a review of symptoms is otherwise negative. Pertinent ROS below.    Review of Systems  Constitutional:  Positive for malaise/fatigue.  Cardiovascular:  Negative for leg swelling.  Musculoskeletal:  Positive for joint pain.  Skin:  Negative for rash.  Neurological:  Negative for tingling.     PHYSICAL EXAM:  Wt Readings from Last 3 Encounters:  12/30/23 201 lb 9.6 oz (91.4 kg)  01/14/20 201 lb (91.2 kg)  08/31/14 199 lb 3.2 oz (90.4 kg)   Temp Readings from Last 3 Encounters:  12/30/23 98.8 F (37.1 C)  01/14/20 98.2 F (36.8 C) (Oral)  08/31/14 97.9 F (36.6 C) (Oral)   BP Readings from Last 3 Encounters:  12/30/23 (!) 148/96  01/14/20 (!) 144/74  08/31/14 133/80   Pulse Readings from Last 3 Encounters:  12/30/23 77  01/14/20 77  08/31/14 85   Pain Assessment Pain Score: 8  Pain Loc: Knee/10   Physical Exam Vitals and nursing note reviewed.  Constitutional:      General: She is not in acute distress. HENT:     Head: Normocephalic.  Cardiovascular:     Rate and Rhythm: Normal rate.  Pulmonary:      Effort: Pulmonary effort is normal. No respiratory distress.  Abdominal:     General: There is no distension.  Musculoskeletal:        General: Swelling and tenderness present.     Cervical back: Normal range of motion.     Comments: Tenderness to palpation of bilateral knee joints, R greater than L, most pronounced in medial compartments; swelling of the bilateral knee joints, R greater than L  Skin:    General: Skin is warm and dry.  Neurological:     General: No focal deficit present.     Mental Status: She is alert.  Psychiatric:        Mood and Affect: Mood normal.      LABORATORY DATA:  Lab Results  Component Value Date   WBC 7.2 01/14/2020   HGB 12.8 01/14/2020   HCT 38.3 01/14/2020   MCV 100.8 (H) 01/14/2020   PLT 186 01/14/2020   Lab Results  Component Value Date   NA 137 01/14/2020   K 3.8 01/14/2020   CL 101 01/14/2020   CO2 27 01/14/2020   Lab  Results  Component Value Date   ALT 89 (H) 01/14/2020   AST 149 (H) 01/14/2020   ALKPHOS 64 01/14/2020   BILITOT 0.6 01/14/2020      RADIOGRAPHY:   X-ray L Knee 10/22/23:  Moderate tricompartmental arthritis, moderate to severe patellofemoral with osteophytes and joint space narrowing    X-ray bilateral knees 07/16/2022: significant degenerative changes, valgus alignment   PATHOLOGY: no pertinent pathology     IMPRESSION/PLAN:  Ms. Delores is a 58 yo F w/ symptomatic moderate-severe bilateral knee osteoarthritis refractory to conservative management, resulting in significant impairment of quality of life and occupational function. Given the persistence of symptoms despite medical and interventional therapies, low-dose radiotherapy represents a reasonable and appropriate therapeutic option for symptom relief.   After a detailed discussion of the patient's history, prior treatment response, and current goals, we reviewed the proposed protocol for LDRT targeting the bilateral knees. Our institutional approach  follows a regimen of 3 Gy total, delivered in 6 fractions of 0.5 Gy on a Monday-Wednesday-Friday schedule over two weeks. This dosing is consistent with published guidelines and peer-reviewed data for non-malignant musculoskeletal conditions.  We reviewed:   Goals of care: symptom relief and improved mobility Expected timeline for therapeutic benefit: typically several weeks to months Potential risks, including rare but theoretical concerns regarding late tissue effects or secondary malignancy (risk estimated to be extremely low at this dose and age)    My goal with low dose radiation therapy is to help reduce chronic joint pain and improve her mobility, particularly for activities like walking and standing, especially while at work. This treatment does not reverse joint damage, but it can calm the inflammation in and around the joint that contributes to pain. Based on published data, about 60-80% of patients with osteoarthritis experience meaningful pain relief within a few weeks to months after completing a short course of low-dose radiation. While not everyone responds, the majority of patients do report improvement, and the treatment is generally well tolerated.  The patient was agreeable to proceed. We will arrange CT simulation for planning and initiate treatment pending final review and setup. A follow-up visit will be scheduled approximately 8-12 weeks after completion of LDRT to assess clinical response.  Prescribed a course of tramadol for pain control in the interim.   We personally spent 60 minutes in this encounter including chart review, reviewing radiological studies, meeting face-to-face with the patient, entering orders and completing documentation.    Estefana HERO. Maritza, M.D.

## 2023-12-24 NOTE — Progress Notes (Signed)
 Site of osteoarthritis: Osteoarthritis of L knee, M17.12 Osteoarthritis of R knee, M17.11    How long have you had pain?  She reports progressive bilateral knee pain over several years, which has increasingly limited her ability to perform her occupational duties.    What over the counter or prescription medications have you tried? Oral anti-inflammatories such as naproxen .   Does anything make the pain better or worse? Relieved by rest.      Ambulatory status? Walker? Wheelchair?: Ambulatory  SAFETY ISSUES: Prior radiation? No Pacemaker/ICD? No Possible current pregnancy? No Is the patient on methotrexate? No  Current Complaints / other details:    BP (!) 148/96 (Patient Position: Sitting) Comment: rechecked 151/97  Pulse 77   Temp 98.8 F (37.1 C)   Ht 5' 6 (1.676 m)   Wt 201 lb 9.6 oz (91.4 kg)   BMI 32.54 kg/m

## 2023-12-30 ENCOUNTER — Ambulatory Visit
Admission: RE | Admit: 2023-12-30 | Discharge: 2023-12-30 | Disposition: A | Source: Ambulatory Visit | Attending: Radiation Oncology | Admitting: Radiation Oncology

## 2023-12-30 ENCOUNTER — Ambulatory Visit
Admission: RE | Admit: 2023-12-30 | Discharge: 2023-12-30 | Disposition: A | Discharge status: Home / Self Care | Payer: Self-pay | Source: Ambulatory Visit | Attending: Radiation Oncology | Admitting: Radiation Oncology

## 2023-12-30 VITALS — BP 148/96 | HR 77 | Temp 98.8°F | Ht 66.0 in | Wt 201.6 lb

## 2023-12-30 DIAGNOSIS — M1712 Unilateral primary osteoarthritis, left knee: Secondary | ICD-10-CM | POA: Insufficient documentation

## 2023-12-30 DIAGNOSIS — Z7952 Long term (current) use of systemic steroids: Secondary | ICD-10-CM | POA: Diagnosis not present

## 2023-12-30 DIAGNOSIS — M1711 Unilateral primary osteoarthritis, right knee: Secondary | ICD-10-CM | POA: Diagnosis not present

## 2023-12-30 DIAGNOSIS — F17210 Nicotine dependence, cigarettes, uncomplicated: Secondary | ICD-10-CM | POA: Insufficient documentation

## 2023-12-30 MED ORDER — TRAMADOL HCL 50 MG PO TABS
50.0000 mg | ORAL_TABLET | Freq: Four times a day (QID) | ORAL | 0 refills | Status: AC | PRN
Start: 2023-12-30 — End: 2024-01-29

## 2023-12-30 NOTE — Addendum Note (Signed)
 Encounter addended by: Claudene Eudora GAILS, LPN on: 88/82/7974 10:13 AM  Actions taken: Visit diagnoses modified

## 2023-12-30 NOTE — Addendum Note (Signed)
 Encounter addended by: Claudene Eudora GAILS, LPN on: 88/82/7974 10:22 AM  Actions taken: Visit diagnoses modified

## 2023-12-30 NOTE — Addendum Note (Signed)
 Encounter addended by: Claudene Eudora GAILS, LPN on: 88/82/7974 10:23 AM  Actions taken: Visit diagnoses modified

## 2023-12-31 ENCOUNTER — Telehealth: Payer: Self-pay | Admitting: Radiation Oncology

## 2023-12-31 NOTE — Telephone Encounter (Signed)
 11/18 Received voicemail from patient after she received an missed call from [facility] email forward to CT Sim and copied nursing/Support RTT, so they are aware.

## 2024-01-02 ENCOUNTER — Telehealth: Payer: Self-pay | Admitting: Radiation Oncology

## 2024-01-02 NOTE — Telephone Encounter (Signed)
 Pt called asking to speak with Dr. Maritza. Pt was expecting c/b regarding gel injections happening today. She also wanted to let Dr. Maritza know that the rx that was sent to pt's pharmacy could not be filled, they did not have it. Pt wanting to know what needed to be done? Call forwarded to nurse Tarra.

## 2024-01-07 ENCOUNTER — Telehealth: Payer: Self-pay

## 2024-01-07 NOTE — Telephone Encounter (Signed)
 Called patient this morning to notify her that her prescription refill was ready at [facility] in [facility]. Patient was appreciative.

## 2024-01-13 ENCOUNTER — Ambulatory Visit: Admission: RE | Admit: 2024-01-13 | Source: Ambulatory Visit | Admitting: Radiation Oncology

## 2024-01-14 ENCOUNTER — Telehealth: Payer: Self-pay | Admitting: Radiation Oncology

## 2024-01-14 NOTE — Telephone Encounter (Signed)
 12/2 Patient called to be r/s to earlier date for her sim appt due to being in a lot of pain.  Email forward to Lane/CT Sim and copied [place]/Katrina/Support RTT, so they are aware.

## 2024-01-15 ENCOUNTER — Ambulatory Visit: Admitting: Radiation Oncology

## 2024-01-15 ENCOUNTER — Ambulatory Visit
Admission: RE | Admit: 2024-01-15 | Discharge: 2024-01-15 | Attending: Radiation Oncology | Admitting: Radiation Oncology

## 2024-01-15 DIAGNOSIS — M170 Bilateral primary osteoarthritis of knee: Secondary | ICD-10-CM | POA: Insufficient documentation

## 2024-01-15 DIAGNOSIS — Z510 Encounter for antineoplastic radiation therapy: Secondary | ICD-10-CM | POA: Insufficient documentation

## 2024-01-20 ENCOUNTER — Ambulatory Visit: Admitting: Radiation Oncology

## 2024-01-20 DIAGNOSIS — Z510 Encounter for antineoplastic radiation therapy: Secondary | ICD-10-CM | POA: Diagnosis not present

## 2024-01-21 ENCOUNTER — Ambulatory Visit

## 2024-01-22 ENCOUNTER — Other Ambulatory Visit: Payer: Self-pay

## 2024-01-22 ENCOUNTER — Ambulatory Visit
Admission: RE | Admit: 2024-01-22 | Discharge: 2024-01-22 | Attending: Radiation Oncology | Admitting: Radiation Oncology

## 2024-01-22 ENCOUNTER — Ambulatory Visit

## 2024-01-22 DIAGNOSIS — Z510 Encounter for antineoplastic radiation therapy: Secondary | ICD-10-CM | POA: Diagnosis not present

## 2024-01-22 LAB — RAD ONC ARIA SESSION SUMMARY
Course Elapsed Days: 0
Plan Fractions Treated to Date: 1
Plan Prescribed Dose Per Fraction: 0.5 Gy
Plan Total Fractions Prescribed: 6
Plan Total Prescribed Dose: 3 Gy
Reference Point Dosage Given to Date: 0.5 Gy
Reference Point Session Dosage Given: 0.5 Gy
Session Number: 1

## 2024-01-23 ENCOUNTER — Ambulatory Visit

## 2024-01-24 ENCOUNTER — Ambulatory Visit: Admitting: Radiation Oncology

## 2024-01-24 ENCOUNTER — Ambulatory Visit: Admission: RE | Admit: 2024-01-24 | Discharge: 2024-01-24 | Attending: Radiation Oncology

## 2024-01-24 ENCOUNTER — Other Ambulatory Visit: Payer: Self-pay

## 2024-01-24 ENCOUNTER — Ambulatory Visit

## 2024-01-24 DIAGNOSIS — Z510 Encounter for antineoplastic radiation therapy: Secondary | ICD-10-CM | POA: Diagnosis not present

## 2024-01-24 LAB — RAD ONC ARIA SESSION SUMMARY
Course Elapsed Days: 2
Plan Fractions Treated to Date: 2
Plan Prescribed Dose Per Fraction: 0.5 Gy
Plan Total Fractions Prescribed: 6
Plan Total Prescribed Dose: 3 Gy
Reference Point Dosage Given to Date: 1 Gy
Reference Point Session Dosage Given: 0.5 Gy
Session Number: 2

## 2024-01-27 ENCOUNTER — Ambulatory Visit: Admission: RE | Admit: 2024-01-27 | Discharge: 2024-01-27 | Attending: Radiation Oncology

## 2024-01-27 ENCOUNTER — Other Ambulatory Visit: Payer: Self-pay

## 2024-01-27 ENCOUNTER — Ambulatory Visit

## 2024-01-27 DIAGNOSIS — Z510 Encounter for antineoplastic radiation therapy: Secondary | ICD-10-CM | POA: Diagnosis not present

## 2024-01-27 LAB — RAD ONC ARIA SESSION SUMMARY
Course Elapsed Days: 5
Plan Fractions Treated to Date: 3
Plan Prescribed Dose Per Fraction: 0.5 Gy
Plan Total Fractions Prescribed: 6
Plan Total Prescribed Dose: 3 Gy
Reference Point Dosage Given to Date: 1.5 Gy
Reference Point Session Dosage Given: 0.5 Gy
Session Number: 3

## 2024-01-29 ENCOUNTER — Ambulatory Visit: Admission: RE | Admit: 2024-01-29 | Discharge: 2024-01-29 | Attending: Radiation Oncology

## 2024-01-29 ENCOUNTER — Other Ambulatory Visit: Payer: Self-pay

## 2024-01-29 ENCOUNTER — Ambulatory Visit

## 2024-01-29 DIAGNOSIS — Z510 Encounter for antineoplastic radiation therapy: Secondary | ICD-10-CM | POA: Diagnosis not present

## 2024-01-29 LAB — RAD ONC ARIA SESSION SUMMARY
Course Elapsed Days: 7
Plan Fractions Treated to Date: 4
Plan Prescribed Dose Per Fraction: 0.5 Gy
Plan Total Fractions Prescribed: 6
Plan Total Prescribed Dose: 3 Gy
Reference Point Dosage Given to Date: 2 Gy
Reference Point Session Dosage Given: 0.5 Gy
Session Number: 4

## 2024-01-31 ENCOUNTER — Ambulatory Visit

## 2024-01-31 ENCOUNTER — Ambulatory Visit: Admitting: Radiation Oncology

## 2024-01-31 ENCOUNTER — Other Ambulatory Visit: Payer: Self-pay

## 2024-01-31 DIAGNOSIS — Z510 Encounter for antineoplastic radiation therapy: Secondary | ICD-10-CM | POA: Diagnosis not present

## 2024-01-31 LAB — RAD ONC ARIA SESSION SUMMARY
Course Elapsed Days: 9
Plan Fractions Treated to Date: 5
Plan Prescribed Dose Per Fraction: 0.5 Gy
Plan Total Fractions Prescribed: 6
Plan Total Prescribed Dose: 3 Gy
Reference Point Dosage Given to Date: 2.5 Gy
Reference Point Session Dosage Given: 0.5 Gy
Session Number: 5

## 2024-02-03 ENCOUNTER — Ambulatory Visit: Admission: RE | Admit: 2024-02-03 | Discharge: 2024-02-03 | Attending: Radiation Oncology

## 2024-02-03 ENCOUNTER — Ambulatory Visit

## 2024-02-03 ENCOUNTER — Other Ambulatory Visit: Payer: Self-pay

## 2024-02-03 DIAGNOSIS — Z510 Encounter for antineoplastic radiation therapy: Secondary | ICD-10-CM | POA: Diagnosis not present

## 2024-02-03 LAB — RAD ONC ARIA SESSION SUMMARY
Course Elapsed Days: 12
Plan Fractions Treated to Date: 6
Plan Prescribed Dose Per Fraction: 0.5 Gy
Plan Total Fractions Prescribed: 6
Plan Total Prescribed Dose: 3 Gy
Reference Point Dosage Given to Date: 3 Gy
Reference Point Session Dosage Given: 0.5 Gy
Session Number: 6

## 2024-02-04 ENCOUNTER — Ambulatory Visit

## 2024-02-04 NOTE — Radiation Completion Notes (Signed)
 Patient Name: LUX, SKILTON MRN: [id number] Date of Birth: 1965-06-21 Referring Physician: LONNI RAKES, M.D. Date of Service: 2024-02-04 Radiation Oncologist: Estefana Cha, M.D. [facility]                             RADIATION ONCOLOGY END OF TREATMENT NOTE     Diagnosis: M17.0 Bilateral primary osteoarthritis of knee Intent: Curative     ==========DELIVERED PLANS==========  First Treatment Date: 2024-01-22 Last Treatment Date: 2024-02-03   Plan Name: Ext_Knees Site: Knee, Left Technique: Isodose Plan Mode: Photon Dose Per Fraction: 0.5 Gy Prescribed Dose (Delivered / Prescribed): 3 Gy / 3 Gy Prescribed Fxs (Delivered / Prescribed): 6 / 6     ==========ON TREATMENT VISIT DATES========== 2024-01-31     ==========UPCOMING VISITS========== 04/01/2024 [facility]-RADIATION ONC FOLLOW UP 30 Cha Estefana, MD  03/05/2024 [facility]-RADIATION ONC FOLLOW UP 20 Cha Estefana, MD        ==========APPENDIX - ON TREATMENT VISIT NOTES==========   See weekly On Treatment Notes in Epic for details in the Media tab (listed as Progress notes on the On Treatment Visit Dates listed above).

## 2024-02-05 ENCOUNTER — Ambulatory Visit

## 2024-02-10 ENCOUNTER — Ambulatory Visit

## 2024-02-11 ENCOUNTER — Ambulatory Visit

## 2024-02-12 ENCOUNTER — Ambulatory Visit

## 2024-02-14 ENCOUNTER — Ambulatory Visit

## 2024-03-05 ENCOUNTER — Ambulatory Visit: Admitting: Radiation Oncology

## 2024-04-01 ENCOUNTER — Ambulatory Visit: Admitting: Radiation Oncology
